# Patient Record
Sex: Male | Born: 1965 | Race: Black or African American | Hispanic: No | Marital: Married | Smoking: Never smoker
Health system: Southern US, Community
[De-identification: ages and names within clinical notes are randomized; demographics above are authoritative.]

## PROBLEM LIST (undated history)

## (undated) DIAGNOSIS — C801 Malignant (primary) neoplasm, unspecified: Secondary | ICD-10-CM

## (undated) HISTORY — PX: BLADDER TUMOR EXCISION: SHX238

---

## 1999-08-30 ENCOUNTER — Emergency Department (HOSPITAL_COMMUNITY): Admission: EM | Admit: 1999-08-30 | Discharge: 1999-08-30 | Payer: Self-pay | Admitting: Emergency Medicine

## 1999-08-31 ENCOUNTER — Encounter: Payer: Self-pay | Admitting: Emergency Medicine

## 1999-09-01 ENCOUNTER — Encounter: Payer: Self-pay | Admitting: Family Medicine

## 1999-09-01 ENCOUNTER — Ambulatory Visit (HOSPITAL_COMMUNITY): Admission: RE | Admit: 1999-09-01 | Discharge: 1999-09-01 | Payer: Self-pay | Admitting: Family Medicine

## 1999-09-02 ENCOUNTER — Encounter: Admission: RE | Admit: 1999-09-02 | Discharge: 1999-09-02 | Payer: Self-pay | Admitting: Gastroenterology

## 1999-09-02 ENCOUNTER — Encounter: Payer: Self-pay | Admitting: Gastroenterology

## 2001-07-29 ENCOUNTER — Emergency Department (HOSPITAL_COMMUNITY): Admission: EM | Admit: 2001-07-29 | Discharge: 2001-07-29 | Payer: Self-pay | Admitting: Emergency Medicine

## 2002-03-18 ENCOUNTER — Encounter: Payer: Self-pay | Admitting: Gastroenterology

## 2002-03-18 ENCOUNTER — Ambulatory Visit (HOSPITAL_COMMUNITY): Admission: RE | Admit: 2002-03-18 | Discharge: 2002-03-18 | Payer: Self-pay | Admitting: Gastroenterology

## 2021-02-14 ENCOUNTER — Encounter (HOSPITAL_BASED_OUTPATIENT_CLINIC_OR_DEPARTMENT_OTHER): Payer: Self-pay | Admitting: Obstetrics and Gynecology

## 2021-02-14 ENCOUNTER — Other Ambulatory Visit: Payer: Self-pay

## 2021-02-14 ENCOUNTER — Emergency Department (HOSPITAL_BASED_OUTPATIENT_CLINIC_OR_DEPARTMENT_OTHER): Payer: BC Managed Care – PPO | Admitting: Radiology

## 2021-02-14 ENCOUNTER — Emergency Department (HOSPITAL_BASED_OUTPATIENT_CLINIC_OR_DEPARTMENT_OTHER)
Admission: EM | Admit: 2021-02-14 | Discharge: 2021-02-14 | Disposition: A | Discharge status: Home / Self Care | Payer: BC Managed Care – PPO | Attending: Emergency Medicine | Admitting: Emergency Medicine

## 2021-02-14 DIAGNOSIS — R110 Nausea: Secondary | ICD-10-CM | POA: Diagnosis not present

## 2021-02-14 DIAGNOSIS — Z859 Personal history of malignant neoplasm, unspecified: Secondary | ICD-10-CM | POA: Diagnosis not present

## 2021-02-14 DIAGNOSIS — R002 Palpitations: Secondary | ICD-10-CM | POA: Insufficient documentation

## 2021-02-14 DIAGNOSIS — R0789 Other chest pain: Secondary | ICD-10-CM | POA: Insufficient documentation

## 2021-02-14 DIAGNOSIS — R61 Generalized hyperhidrosis: Secondary | ICD-10-CM | POA: Insufficient documentation

## 2021-02-14 DIAGNOSIS — R079 Chest pain, unspecified: Secondary | ICD-10-CM

## 2021-02-14 HISTORY — DX: Malignant (primary) neoplasm, unspecified: C80.1

## 2021-02-14 LAB — BASIC METABOLIC PANEL
Anion gap: 8 (ref 5–15)
BUN: 15 mg/dL (ref 6–20)
CO2: 28 mmol/L (ref 22–32)
Calcium: 9.9 mg/dL (ref 8.9–10.3)
Chloride: 102 mmol/L (ref 98–111)
Creatinine, Ser: 1.1 mg/dL (ref 0.61–1.24)
GFR, Estimated: >60 mL/min (ref >60)
Glucose, Bld: 66 mg/dL — ABNORMAL LOW (ref 70–99)
Potassium: 4 mmol/L (ref 3.5–5.1)
Sodium: 138 mmol/L (ref 135–145)

## 2021-02-14 LAB — CBC
HCT: 45.8 % (ref 39.0–52.0)
Hemoglobin: 15.9 g/dL (ref 13.0–17.0)
MCH: 26.3 pg (ref 26.0–34.0)
MCHC: 34.7 g/dL (ref 30.0–36.0)
MCV: 75.8 fL — ABNORMAL LOW (ref 80.0–100.0)
Platelets: 260 10*3/uL (ref 150–400)
RBC: 6.04 MIL/uL — ABNORMAL HIGH (ref 4.22–5.81)
RDW: 12.3 % (ref 11.5–15.5)
WBC: 5.5 10*3/uL (ref 4.0–10.5)
nRBC: 0 % (ref 0.0–0.2)

## 2021-02-14 LAB — CBG MONITORING, ED: Glucose-Capillary: 76 mg/dL (ref 70–99)

## 2021-02-14 LAB — TROPONIN I (HIGH SENSITIVITY)
Troponin I (High Sensitivity): 3 ng/L (ref <18)
Troponin I (High Sensitivity): 4 ng/L (ref <18)

## 2021-02-14 NOTE — ED Provider Notes (Signed)
[facility] EMERGENCY DEPT Provider Note   CSN: [id number] Arrival date & time: 02/14/21  1233     History Chief Complaint  Patient presents with   Chest Pain    Eric Gutierrez is a 55 y.o. male.   Chest Pain  HPI: A 55 year old patient presents for evaluation of chest pain. Initial onset of pain was approximately 3-6 hours ago. The patient's chest pain is described as heaviness/pressure/tightness and is not worse with exertion. The patient complains of nausea and reports some diaphoresis. The patient's chest pain is middle- or left-sided, is not well-localized, is not sharp and does not radiate to the arms/jaw/neck. The patient has no history of stroke, has no history of peripheral artery disease, has not smoked in the past 90 days, denies any history of treated diabetes, has no relevant family history of coronary artery disease (first degree relative at less than age 68), is not hypertensive, has no history of hypercholesterolemia and does not have an elevated BMI (>=30).  All the symptoms have resolved at this point. Past Medical History:  Diagnosis Date   Cancer ([place])     There are no problems to display for this patient.   Past Surgical History:  Procedure Laterality Date   BLADDER TUMOR EXCISION         Family History  Family history unknown: Yes    Social History   Tobacco Use   Smoking status: Never    Passive exposure: Never   Smokeless tobacco: Never  Vaping Use   Vaping Use: Never used  Substance Use Topics   Alcohol use: Yes    Comment: Social   Drug use: Never    Home Medications Prior to Admission medications   Not on File    Allergies    Patient has no allergy information on record.  Review of Systems   Review of Systems  Cardiovascular:  Positive for chest pain.  All other systems reviewed and are negative.  Physical Exam Updated Vital Signs BP 111/78   Pulse (!) 57   Temp 98.5 F (36.9 C)   Resp 11   Ht 1.829 m  (6')   Wt 83.9 kg   SpO2 100%   BMI 25.09 kg/m   Physical Exam Vitals and nursing note reviewed.  Constitutional:      General: He is not in acute distress.    Appearance: He is well-developed.  HENT:     Head: Normocephalic and atraumatic.     Right Ear: External ear normal.     Left Ear: External ear normal.  Eyes:     General: No scleral icterus.       Right eye: No discharge.        Left eye: No discharge.     Conjunctiva/sclera: Conjunctivae normal.  Neck:     Trachea: No tracheal deviation.  Cardiovascular:     Rate and Rhythm: Normal rate and regular rhythm.  Pulmonary:     Effort: Pulmonary effort is normal. No respiratory distress.     Breath sounds: Normal breath sounds. No stridor. No wheezing or rales.  Abdominal:     General: Bowel sounds are normal. There is no distension.     Palpations: Abdomen is soft.     Tenderness: There is no abdominal tenderness. There is no guarding or rebound.  Musculoskeletal:        General: No tenderness or deformity.     Cervical back: Neck supple.  Skin:    General: Skin  is warm and dry.     Findings: No rash.  Neurological:     General: No focal deficit present.     Mental Status: He is alert.     Cranial Nerves: No cranial nerve deficit (no facial droop, extraocular movements intact, no slurred speech).     Sensory: No sensory deficit.     Motor: No abnormal muscle tone or seizure activity.     Coordination: Coordination normal.  Psychiatric:        Mood and Affect: Mood normal.    ED Results / Procedures / Treatments   Labs (all labs ordered are listed, but only abnormal results are displayed) Labs Reviewed  BASIC METABOLIC PANEL - Abnormal; Notable for the following components:      Result Value   Glucose, Bld 66 (*)    All other components within normal limits  CBC - Abnormal; Notable for the following components:   RBC 6.04 (*)    MCV 75.8 (*)    All other components within normal limits  CBG MONITORING,  ED  TROPONIN I (HIGH SENSITIVITY)  TROPONIN I (HIGH SENSITIVITY)    EKG EKG Interpretation  Date/Time:  Sunday February 14 2021 12:44:32 EST Ventricular Rate:  85 PR Interval:  150 QRS Duration: 84 QT Interval:  352 QTC Calculation: 418 R Axis:   91 Text Interpretation: Normal sinus rhythm Rightward axis Borderline ECG No old tracing to compare Confirmed by Dorie Rank [id number]) on 02/14/2021 2:17:17 PM  Radiology DG Chest 2 View  Result Date: 02/14/2021 CLINICAL DATA:  Chest pain EXAM: CHEST - 2 VIEW COMPARISON:  None. FINDINGS: The heart size and mediastinal contours are within normal limits. Both lungs are clear. The visualized skeletal structures are unremarkable. IMPRESSION: No active cardiopulmonary disease. Electronically Signed   By: Fidela Salisbury M.D.   On: 02/14/2021 14:04    Procedures Procedures   Medications Ordered in ED Medications - No data to display  ED Course  I have reviewed the triage vital signs and the nursing notes.  Pertinent labs & imaging results that were available during my care of the patient were reviewed by me and considered in my medical decision making (see chart for details).    MDM Rules/Calculators/A&P HEAR Score: 3                         Patient presented to the ED for evaluation of chest discomfort and palpitations.  Patient did notice his heart racing on his watch.  It was up into the 130s.  Initial ED work-up is reassuring.  Initial troponin is normal.  No dysrhythmia noted on the EKG.  Chest x-ray without pneumonia.  vitals unremarkable .  Delta troponin has been ordered.  Anticipate discharge if that is normal with outpatient follow-up.  Care will be turned over to oncoming MD Final Clinical Impression(s) / ED Diagnoses Final diagnoses:  Chest pain, unspecified type  Palpitations    Rx / DC Orders ED Discharge Orders     None        Dorie Rank, MD 02/14/21 1524

## 2021-02-14 NOTE — ED Provider Notes (Addendum)
Pt signed out by Dr. Tomi Bamberger pending 2nd trop.  2nd trop neg.  He is referred to cards because of palpitations and cp.  He is stable for d/c.  Return if worse.    Isla Pence, MD 02/14/21 [place], Cloris Flippo, MD 02/14/21 1656    Isla Pence, MD 02/14/21 [phone]

## 2021-02-14 NOTE — ED Notes (Signed)
S 1 S2 still good

## 2021-02-14 NOTE — ED Triage Notes (Signed)
Patient reports about 1.5 hours ago he had some heart palpitations and felt sweaty. Patient states his watched alerted that his heart rate was 130

## 2021-03-24 ENCOUNTER — Ambulatory Visit (HOSPITAL_BASED_OUTPATIENT_CLINIC_OR_DEPARTMENT_OTHER): Payer: BC Managed Care – PPO | Admitting: Cardiology

## 2022-02-15 IMAGING — DX DG CHEST 2V
2 series · 2 of 2 positions shown · non-contrast
Comparison: None.

CLINICAL DATA: Chest pain

EXAM:
CHEST - 2 VIEW

[chest pa]
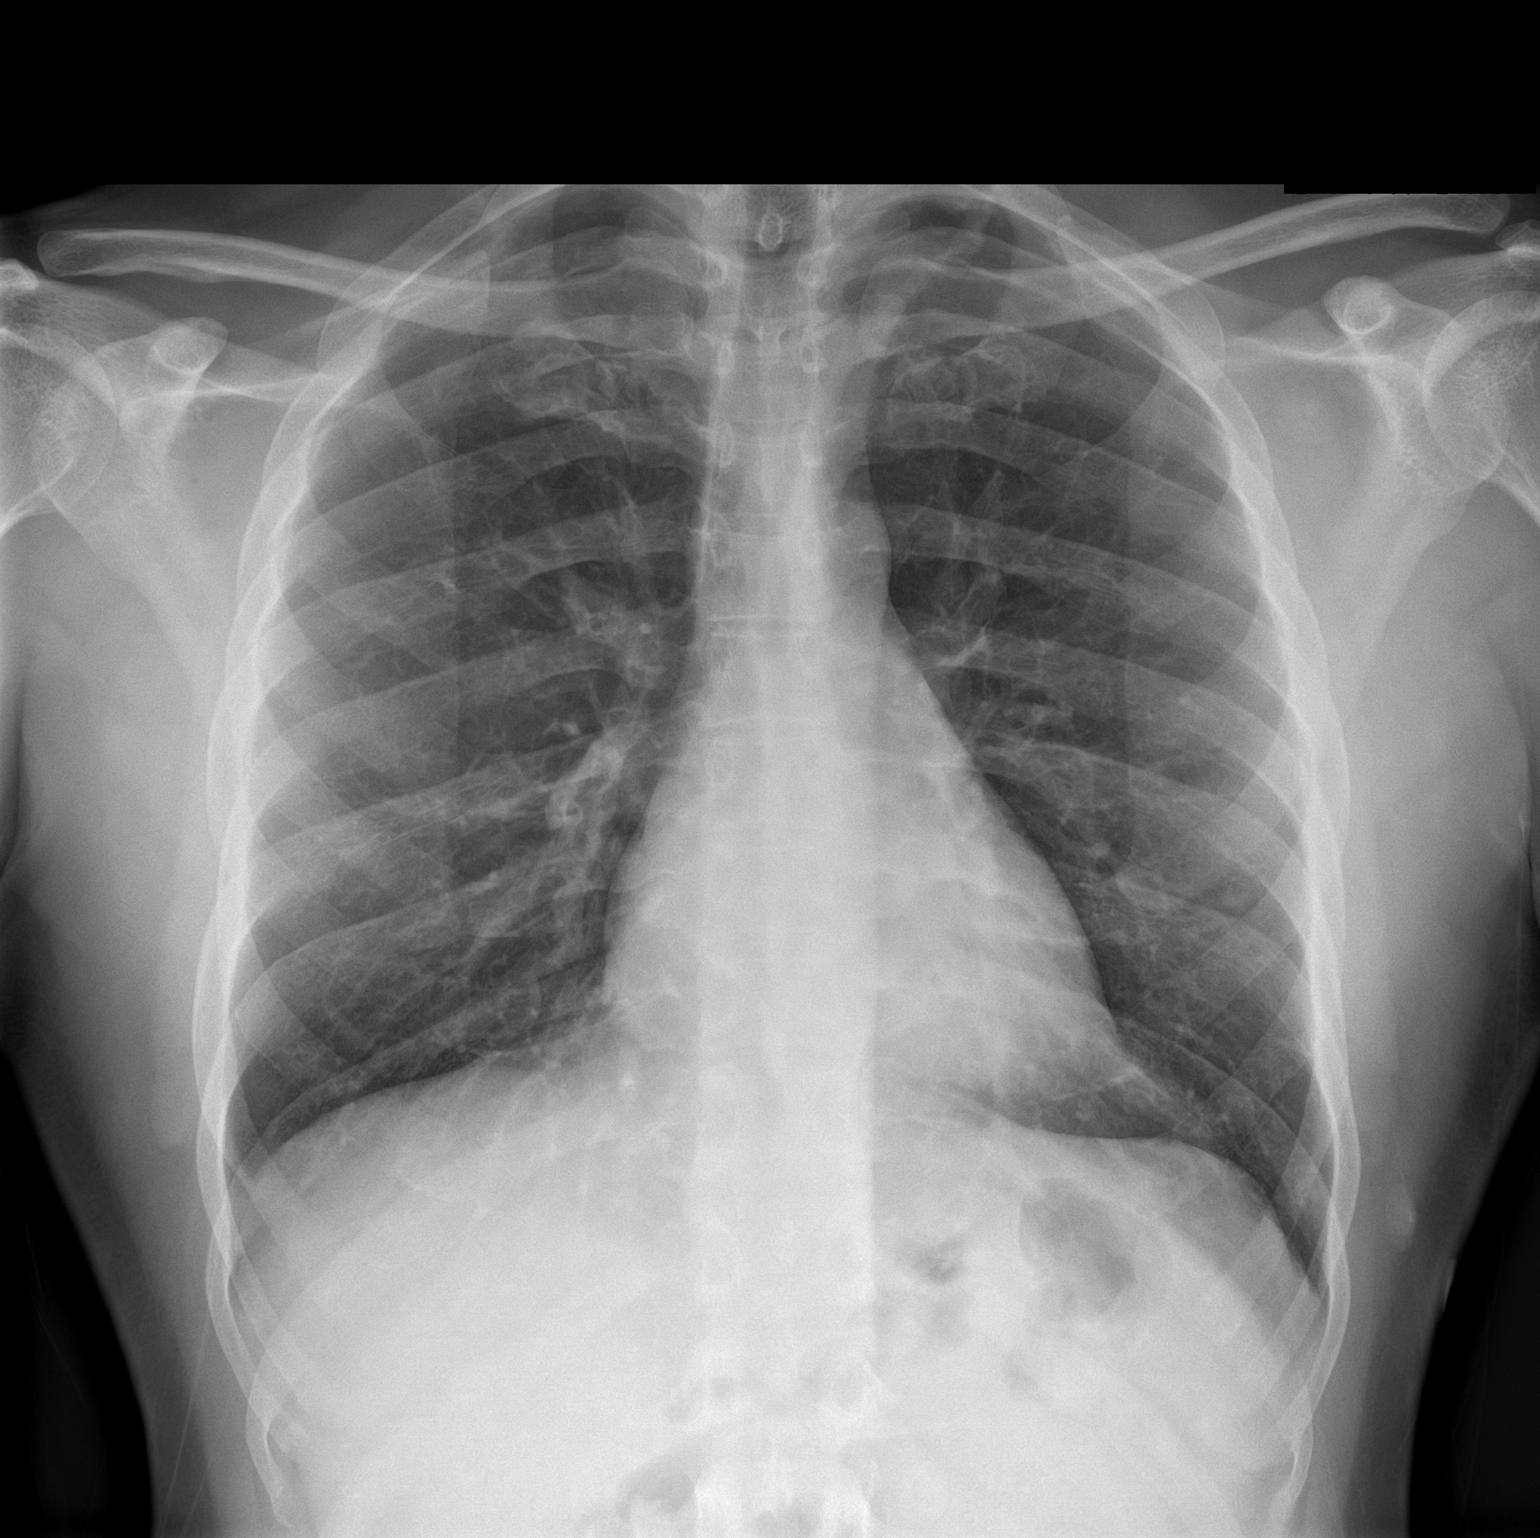

[chest lat]
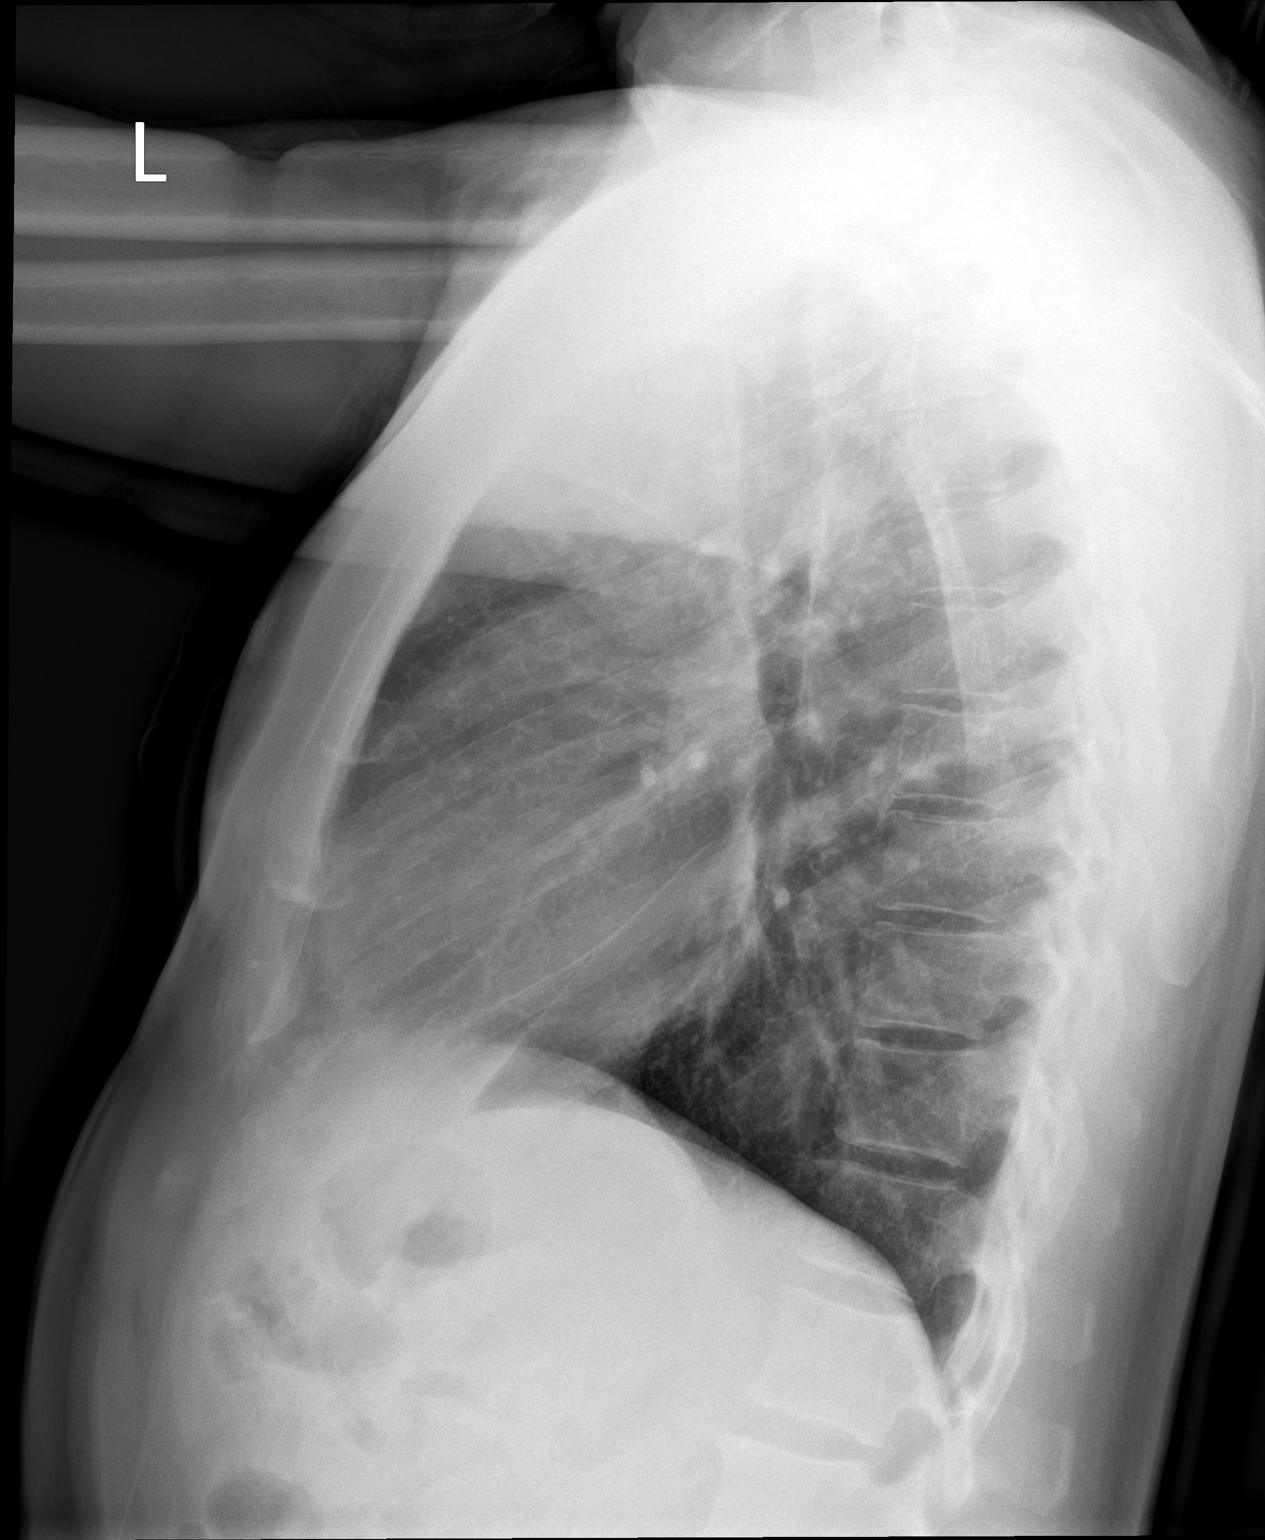

[2 of 2 positions shown; findings below may reference images not displayed]

FINDINGS: The heart size and mediastinal contours are within normal limits.
Both lungs are clear. The visualized skeletal structures are
unremarkable.
IMPRESSION: No active cardiopulmonary disease.
